# Patient Record
Sex: Male | Born: 1975 | Race: Black or African American | Hispanic: No | Marital: Married | Smoking: Never smoker
Health system: Southern US, Community
[De-identification: ages and names within clinical notes are randomized; demographics above are authoritative.]

## PROBLEM LIST (undated history)

## (undated) DIAGNOSIS — Z8619 Personal history of other infectious and parasitic diseases: Secondary | ICD-10-CM

## (undated) HISTORY — DX: Personal history of other infectious and parasitic diseases: Z86.19

## (undated) HISTORY — PX: INGUINAL HERNIA REPAIR: SUR1180

---

## 2010-06-27 ENCOUNTER — Encounter: Payer: Self-pay | Admitting: Family Medicine

## 2010-06-27 ENCOUNTER — Ambulatory Visit (INDEPENDENT_AMBULATORY_CARE_PROVIDER_SITE_OTHER): Payer: BC Managed Care – PPO | Admitting: Family Medicine

## 2010-06-27 VITALS — BP 110/70 | HR 76 | Temp 98.8°F | Ht 67.0 in | Wt 140.0 lb

## 2010-06-27 DIAGNOSIS — R51 Headache: Secondary | ICD-10-CM

## 2010-06-27 DIAGNOSIS — M25519 Pain in unspecified shoulder: Secondary | ICD-10-CM

## 2010-06-27 DIAGNOSIS — R519 Headache, unspecified: Secondary | ICD-10-CM | POA: Insufficient documentation

## 2010-06-27 DIAGNOSIS — M25511 Pain in right shoulder: Secondary | ICD-10-CM | POA: Insufficient documentation

## 2010-06-27 DIAGNOSIS — Z0000 Encounter for general adult medical examination without abnormal findings: Secondary | ICD-10-CM

## 2010-06-27 DIAGNOSIS — R21 Rash and other nonspecific skin eruption: Secondary | ICD-10-CM

## 2010-06-27 NOTE — Assessment & Plan Note (Signed)
Exam normal today. Possible mild rotator cuff tendinitis, provided with stretching/strengthening exercises for shoulder as well as rubber band.  If not better, further eval.

## 2010-06-27 NOTE — Patient Instructions (Signed)
I think you have pityriasis rosea.  See below.  If not improving in next few weeks, let me know for referral to dermatologist. For shoulder - do stretching exercises provided.  I'm not sure what's causing this pain because exam normal today, but most common cause of shoulder pain is rotator cuff irritation/injury.  If not improving, let us know to set you up with physical therapy. Return at your convenience fasting for blood work (day of physical or a few days prior) and for physical (bring copy of immunizations to that visit). Good to see you today, call us with quesitons.  Pityriasis Rosea Pityriasis rosea is a rash which is probably caused by a virus. It generally starts as a scaly, red patch on the trunk (area a t-shirt would cover) but does not appear on sun exposed areas. The rash is usually preceded by an initial larger spot called the "Herald Patch" a week or more before the rest of the rash appears. Generally within one to two days the rash appears rapidly on the trunk, upper arms, and sometimes the upper legs. The rash usually appears as flat, oval patches of scaly pink to salmon-color. The rash can also be raised and one is able to feel it with their finger. The rash can also be finely crinkled and may slough off leaving a ring of scale around the spot. Sometimes a mild sore throat is present with the rash. It usually affects children and young adults in the spring and autumn. Women are more frequently affected than men. TREATMENT Pityriasis rosea is a self-limited condition. This means it goes away within 4 to 8 weeks without treatment. The spots may persist for several months especially in darker-colored skin after the rash has resolved and healed. Benadryl and steroid creams may be used if itching is a problem. SEEK MEDICAL CARE IF:  Your rash does not go away or persists longer than three months.   You develop fever and joint pain.   You develop severe headache and confusion.   You  develop breathing difficulty, vomiting and/or extreme weakness.  Document Released: 02/28/2001 Document Re-Released: 04/20/2008 [facility] Patient Information 2011 [place], [place].

## 2010-06-27 NOTE — Assessment & Plan Note (Signed)
Unclear etiology, story doesn't correlate with any specific headache pattern.  Monitor for now, rec more exercise in regimen, see if improves as summer months stress decreasing. If not improved, further eval. Return fasting for blood work for [employer]

## 2010-06-27 NOTE — Progress Notes (Signed)
Subjective:    Patient ID: Ryan Riley, male    DOB: July 09, 1975, 35 y.o.   MRN: [id number]  HPI CC: new patient, establish  Requests physical.  Not fasting today.  Will return for this.  No previous PCP.  Skin rash - Thinks it's ringworm.  Noticed a few weeks ago, thought bug bite first then spreading to back.  Started on chest.  Using lotrimin, tea tree oil, vinegar.  seems to be going away.  No recent travel.  No one else at home with similar rash.  No joint pains other than shoulder, no abd pain, no oral lesions, no n/v/f.  No new lotions, detergents, soaps, shampoos.  Not itchy.  Noticed after cutting grass.  R shoulder issues - Tender since football game early this year.  Sometimes with trouble lifting shoulder above head.  Worse when laying on shoulder for prolonged periods of time.  Started after throwing football.  No radiation of pain.  No shoulder injury in past.  Has not tried anything for pain so far.  Worse with throwing motion of football and basketball.  No HA per say, does get pressure L side of head.  Intermittent.  Does think has recently been under increased stress, doesn't have healthy ways of coping with stress other than mowing lawn.  Medications and allergies reviewed and updated in chart. PMHx, surghx, SHx, fmhx reviewed and updated in chart.  Review of Systems  Constitutional: Negative for fever, chills, activity change, appetite change, fatigue and unexpected weight change.  HENT: Negative for hearing loss and neck pain.   Eyes: Negative for visual disturbance.  Respiratory: Negative for cough, choking, chest tightness, shortness of breath and wheezing.   Cardiovascular: Negative for chest pain, palpitations and leg swelling.  Gastrointestinal: Negative for nausea, vomiting, abdominal pain, diarrhea, constipation, blood in stool and abdominal distention.  Genitourinary: Negative for hematuria and difficulty urinating.  Musculoskeletal: Negative for myalgias and  arthralgias.  Skin: Positive for rash.  Neurological: Negative for dizziness, seizures, syncope and headaches.  Hematological: Does not bruise/bleed easily.  Psychiatric/Behavioral: Negative for dysphoric mood. The patient is not nervous/anxious.        Objective:   Physical Exam  Nursing note and vitals reviewed. Constitutional: He is oriented to person, place, and time. He appears well-developed and well-nourished. No distress.  HENT:  Head: Normocephalic and atraumatic.  Right Ear: External ear normal.  Left Ear: External ear normal.  Nose: Nose normal.  Mouth/Throat: Oropharynx is clear and moist.  Eyes: Conjunctivae and EOM are normal. Pupils are equal, round, and reactive to light.  Neck: Normal range of motion. Neck supple. No thyromegaly present.  Cardiovascular: Normal rate, regular rhythm, normal heart sounds and intact distal pulses.   No murmur heard. Pulses:      Radial pulses are 2+ on the right side, and 2+ on the left side.  Pulmonary/Chest: Effort normal and breath sounds normal. No respiratory distress. He has no wheezes. He has no rales.  Abdominal: Soft. Bowel sounds are normal. He exhibits no distension and no mass. There is no tenderness. There is no rebound and no guarding.  Musculoskeletal: Normal range of motion. He exhibits no edema.       Right shoulder: Normal. He exhibits normal range of motion, no tenderness, no swelling, no effusion and normal strength.       Left shoulder: Normal.       No pain/weakness with testing of SITS Neg seer's, neg empty can sign.   Scapulae move  symmetrically  Lymphadenopathy:    He has no cervical adenopathy.  Neurological: He is alert and oriented to person, place, and time.       CN grossly intact, station and gait intact  Skin: Skin is warm and dry. Rash noted.          Scabs on erythematous base, start as erythematous patches, not pruritic, not tender. No herald patch.  Psychiatric: He has a normal mood and  affect. His behavior is normal. Judgment and thought content normal.          Assessment & Plan:

## 2010-06-27 NOTE — Assessment & Plan Note (Signed)
Pityriasis rosea vs nummular eczema vs other?   Reassured if pityriasis, self limiting.   If not improving by next visit, consider steroid cream or referral to dermatology for further evaluation. No systemic sxs. Lesions not consistent with guttate psoriasis.

## 2010-06-29 ENCOUNTER — Other Ambulatory Visit (INDEPENDENT_AMBULATORY_CARE_PROVIDER_SITE_OTHER): Payer: BC Managed Care – PPO

## 2010-06-29 DIAGNOSIS — R51 Headache: Secondary | ICD-10-CM

## 2010-06-29 DIAGNOSIS — R21 Rash and other nonspecific skin eruption: Secondary | ICD-10-CM

## 2010-06-29 DIAGNOSIS — Z0000 Encounter for general adult medical examination without abnormal findings: Secondary | ICD-10-CM

## 2010-06-29 LAB — COMPREHENSIVE METABOLIC PANEL
ALT: 13 U/L (ref 0–53)
AST: 20 U/L (ref 0–37)
Albumin: 4.2 g/dL (ref 3.5–5.2)
Alkaline Phosphatase: 48 U/L (ref 39–117)
Potassium: 4.4 mEq/L (ref 3.5–5.1)
Sodium: 140 mEq/L (ref 135–145)
Total Bilirubin: 0.7 mg/dL (ref 0.3–1.2)
Total Protein: 7 g/dL (ref 6.0–8.3)

## 2010-06-29 LAB — CBC WITH DIFFERENTIAL/PLATELET
Eosinophils Relative: 7.6 % — ABNORMAL HIGH (ref 0.0–5.0)
Lymphocytes Relative: 23.9 % (ref 12.0–46.0)
MCV: 93.2 fl (ref 78.0–100.0)
Monocytes Absolute: 0.5 10*3/uL (ref 0.1–1.0)
Monocytes Relative: 9.8 % (ref 3.0–12.0)
Neutrophils Relative %: 58 % (ref 43.0–77.0)
Platelets: 182 10*3/uL (ref 150.0–400.0)
RBC: 4.65 Mil/uL (ref 4.22–5.81)
WBC: 4.7 10*3/uL (ref 4.5–10.5)

## 2010-06-29 LAB — LIPID PANEL
LDL Cholesterol: 89 mg/dL (ref 0–99)
Total CHOL/HDL Ratio: 3
VLDL: 7.2 mg/dL (ref 0.0–40.0)

## 2010-06-29 LAB — TSH: TSH: 1.09 u[IU]/mL (ref 0.35–5.50)

## 2010-07-04 ENCOUNTER — Ambulatory Visit (INDEPENDENT_AMBULATORY_CARE_PROVIDER_SITE_OTHER): Payer: BC Managed Care – PPO | Admitting: Family Medicine

## 2010-07-04 ENCOUNTER — Encounter: Payer: Self-pay | Admitting: Family Medicine

## 2010-07-04 VITALS — BP 122/70 | HR 80 | Temp 99.0°F | Wt 142.1 lb

## 2010-07-04 DIAGNOSIS — M25511 Pain in right shoulder: Secondary | ICD-10-CM

## 2010-07-04 DIAGNOSIS — M25519 Pain in unspecified shoulder: Secondary | ICD-10-CM

## 2010-07-04 DIAGNOSIS — Z0000 Encounter for general adult medical examination without abnormal findings: Secondary | ICD-10-CM

## 2010-07-04 DIAGNOSIS — R21 Rash and other nonspecific skin eruption: Secondary | ICD-10-CM

## 2010-07-04 DIAGNOSIS — Z23 Encounter for immunization: Secondary | ICD-10-CM

## 2010-07-04 NOTE — Assessment & Plan Note (Signed)
Discussed healthy eating. Preventative protocols reviewed. Tdap today. Vegetarian - recommended daily MVI, provided with handout on foods with B12.

## 2010-07-04 NOTE — Assessment & Plan Note (Signed)
Last visit thought pityriasis rosea, however per pt multiplying and enlarging. Referral to derm for eval.

## 2010-07-04 NOTE — Progress Notes (Signed)
Addended by: Patience Musca on: 07/04/2010 02:27 PM   Modules accepted: Orders

## 2010-07-04 NOTE — Patient Instructions (Addendum)
If shoulder starts bothering you more, schedule appointment with Dr. Patsy Lager in our office (sports medicine doctor). B12 comes from animal products and dairy.  Recommend daily multivitamin. Pass by Marion's office up front for referral for dermatologist. Tdap today. Good to see you today, call us with questions. Return in 1-2 years for next physical or as needed.

## 2010-07-04 NOTE — Progress Notes (Signed)
  Subjective:    Patient ID: Ryan Riley, male    DOB: 1975-07-20, 35 y.o.   MRN: [id number]  HPI CC: CPE  Skin rash - spots multiplying, previous spots seem to be getting bigger.  Shoulder - doing ok.  Still trouble laying on it at night.  Not bothering him currently.  Uses city water, no recent travel outside of country.  Preventative: Last tetanus 12/05/2000.  Requests Tdap.  OPV x 5, MMR x 2.  Brings log of immunizations. Reviewed blood work today.  Review of Systems  Constitutional: Negative for fever, chills, activity change, appetite change, fatigue and unexpected weight change.  HENT: Negative for hearing loss and neck pain.   Respiratory: Negative for cough, choking, chest tightness, shortness of breath and wheezing.   Cardiovascular: Negative for chest pain, palpitations and leg swelling.  Gastrointestinal: Negative for nausea, vomiting, abdominal pain, diarrhea, constipation, blood in stool and abdominal distention.  Genitourinary: Negative for hematuria and difficulty urinating.  Musculoskeletal: Negative for myalgias and arthralgias.  Skin: Negative for rash.  Neurological: Negative for dizziness, seizures, syncope and headaches.  Hematological: Does not bruise/bleed easily.  Psychiatric/Behavioral: Negative for dysphoric mood. The patient is not nervous/anxious.        Objective:   Physical Exam  Nursing note and vitals reviewed. Constitutional: He is oriented to person, place, and time. He appears well-developed and well-nourished. No distress.  HENT:  Head: Normocephalic and atraumatic.  Right Ear: External ear normal.  Left Ear: External ear normal.  Nose: Nose normal.  Mouth/Throat: Oropharynx is clear and moist.  Eyes: Conjunctivae and EOM are normal. Pupils are equal, round, and reactive to light.  Neck: Normal range of motion. Neck supple. No thyromegaly present.  Cardiovascular: Normal rate, regular rhythm, normal heart sounds and intact distal  pulses.   No murmur heard. Pulses:      Radial pulses are 2+ on the right side, and 2+ on the left side.  Pulmonary/Chest: Effort normal and breath sounds normal. No respiratory distress. He has no wheezes. He has no rales.  Abdominal: Soft. Bowel sounds are normal. He exhibits no distension and no mass. There is no tenderness. There is no rebound and no guarding.  Musculoskeletal: He exhibits no edema.  Lymphadenopathy:    He has no cervical adenopathy.  Neurological: He is alert and oriented to person, place, and time.       CN grossly intact, station and gait intact  Skin: Skin is warm and dry. Rash noted.       Hyperpigmented macules, some raised.  No scabs present today. Some new erythematous papules on back and abd No herald patch.  Psychiatric: He has a normal mood and affect. His behavior is normal. Judgment and thought content normal.          Assessment & Plan:

## 2010-07-04 NOTE — Assessment & Plan Note (Signed)
No recent travel.  Uses city water.  No GI sxs.  No systemic sxs. Absolute eosinophil count WNL.  Monitor for now. ? Seasonal allergies (pt has h/o this).

## 2010-07-04 NOTE — Assessment & Plan Note (Signed)
Actually seems some improved.   Discussed if worsening, to schedule appt with Dr. Salena Saner for eval. ?RTC tendinopathy as worse with certain positions at night, although last visit exam WNL.

## 2013-04-03 ENCOUNTER — Ambulatory Visit (INDEPENDENT_AMBULATORY_CARE_PROVIDER_SITE_OTHER)
Admission: RE | Admit: 2013-04-03 | Discharge: 2013-04-03 | Disposition: A | Discharge status: Home / Self Care | Payer: BC Managed Care – PPO | Source: Ambulatory Visit | Attending: Internal Medicine | Admitting: Internal Medicine

## 2013-04-03 ENCOUNTER — Ambulatory Visit (INDEPENDENT_AMBULATORY_CARE_PROVIDER_SITE_OTHER): Payer: BC Managed Care – PPO | Admitting: Internal Medicine

## 2013-04-03 ENCOUNTER — Encounter: Payer: Self-pay | Admitting: Internal Medicine

## 2013-04-03 VITALS — BP 114/76 | HR 86 | Temp 98.7°F | Wt 144.5 lb

## 2013-04-03 DIAGNOSIS — M25519 Pain in unspecified shoulder: Secondary | ICD-10-CM

## 2013-04-03 DIAGNOSIS — M25531 Pain in right wrist: Secondary | ICD-10-CM

## 2013-04-03 DIAGNOSIS — M25512 Pain in left shoulder: Secondary | ICD-10-CM

## 2013-04-03 DIAGNOSIS — M25539 Pain in unspecified wrist: Secondary | ICD-10-CM

## 2013-04-03 MED ORDER — MELOXICAM 15 MG PO TABS: 15.0000 mg | ORAL_TABLET | Freq: Every day | ORAL | Status: DC

## 2013-04-03 NOTE — Progress Notes (Signed)
Subjective:    Patient ID: Ryan Riley, male    DOB: Dec 27, 1975, 38 y.o.   MRN: [id number]  HPI  Pt presents to the clinic today with c/o left shoulder pain. This started about 2 weeks ago. He describes the pain as sharp. The pain does radiate down to the elbow. He denies any specific injury to the area. He has taken Ibuprofen OTC without any relief. Additionally, he c/o right wrist pain. This started about 2 months ago. It occurs intermittently. He does report that he feels a tingling sensation in his hand. He denies numbness. He thought it may be carpal tunnel so he bought a cock up wrist brace. He does type on a computer a lot.  Review of Systems      Past Medical History  Diagnosis Date  . History of chicken pox     No current outpatient prescriptions on file.   No current facility-administered medications for this visit.    No Known Allergies  Family History  Problem Relation Age of Onset  . Coronary artery disease Father 1950  . Diabetes Maternal Grandmother   . Cancer Paternal Grandmother     ovarian  . Stroke Neg Hx     History   Social History  . Marital Status: Married    Spouse Name: N/A    Number of Children: 2  . Years of Education: PhD   Occupational History  . 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 Smoking status: Never Smoker   . Smokeless tobacco: Never Used  . Alcohol Use: No  . Drug Use: No  . Sexual Activity: Not on file   Other Topics Concern  . Not on file   Social History Narrative   Caffeine: none   Vegetarian, eats occasional fish   Finished PhD in business administration at [employer]   Professor at [employer]   Lives with wife, 2 sons (2002, 2008), no pets     Constitutional: Denies fever, malaise, fatigue, headache or abrupt weight changes.  Musculoskeletal: Pt reports left shoulder pain and right wrist pain. Denies decrease in range of motion, difficulty with  gait, muscle pain or joint swelling.   Neurological: Pt reports tingling in the left arm. Denies dizziness, difficulty with memory, difficulty with speech or problems with balance and coordination.   No other specific complaints in a complete review of systems (except as listed in HPI above).  Objective:   Physical Exam   BP 114/76  Pulse 86  Temp(Src) 98.7 F (37.1 C) (Oral)  Wt 144 lb 8 oz (65.545 kg)  SpO2 98% Wt Readings from Last 3 Encounters:  04/03/13 144 lb 8 oz (65.545 kg)  07/04/10 142 lb 1.3 oz (64.447 kg)  06/27/10 140 lb (63.504 kg)    General: Appears his stated age, well developed, well nourished in NAD.  Cardiovascular: Normal rate and rhythm. S1,S2 noted.  No murmur, rubs or gallops noted. No JVD or BLE edema. No carotid bruits noted. Pulmonary/Chest: Normal effort and positive vesicular breath sounds. No respiratory distress. No wheezes, rales or ronchi noted.  Musculoskeletal: Normal internal rotation of the left shoulder. Decreased external rotations of the left shoulder. Crepitus noted with ROM of left shoulder. No signs of joint swelling. No difficulty with gait.  Neurological: Alert and oriented. + Phalens and Tinels sign. +DTRs bilaterally.   BMET    Component Value Date/Time   NA 140 06/29/2010  0842   K 4.4 06/29/2010 0842   CL 103 06/29/2010 0842   CO2 31 06/29/2010 0842   GLUCOSE 95 06/29/2010 0842   BUN 13 06/29/2010 0842   CREATININE 0.9 06/29/2010 0842   CALCIUM 9.4 06/29/2010 0842    Lipid Panel     Component Value Date/Time   CHOL 142 06/29/2010 0842   TRIG 36.0 06/29/2010 0842   HDL 45.80 06/29/2010 0842   CHOLHDL 3 06/29/2010 0842   VLDL 7.2 06/29/2010 0842   LDLCALC 89 06/29/2010 0842    CBC    Component Value Date/Time   WBC 4.7 06/29/2010 0842   RBC 4.65 06/29/2010 0842   HGB 15.0 06/29/2010 0842   HCT 43.3 06/29/2010 0842   PLT 182.0 06/29/2010 0842   MCV 93.2 06/29/2010 0842   MCHC 34.5 06/29/2010 0842   RDW 12.7 06/29/2010 0842    LYMPHSABS 1.1 06/29/2010 0842   MONOABS 0.5 06/29/2010 0842   EOSABS 0.4 06/29/2010 0842   BASOSABS 0.0 06/29/2010 0842    Hgb A1C No results found for this basename: HGBA1C        Assessment & Plan:   Left shoulder pain:  ? Degenerative disease Will start Mobic daily for pain inflammation Will obtain xray of left shoulder  Right wrist pain:  ? Carpal tunnel The brace he has is not effective He would like referral to sports medicine for further evaluation and treatment  RTC as needed or if symptoms persist

## 2013-04-03 NOTE — Patient Instructions (Addendum)

## 2013-04-03 NOTE — Progress Notes (Signed)
Pre visit review using our clinic review tool, if applicable. No additional management support is needed unless otherwise documented below in the visit note. 

## 2013-04-27 ENCOUNTER — Ambulatory Visit (INDEPENDENT_AMBULATORY_CARE_PROVIDER_SITE_OTHER)
Admission: RE | Admit: 2013-04-27 | Discharge: 2013-04-27 | Disposition: A | Discharge status: Home / Self Care | Payer: BC Managed Care – PPO | Source: Ambulatory Visit | Attending: Family Medicine | Admitting: Family Medicine

## 2013-04-27 ENCOUNTER — Encounter: Payer: Self-pay | Admitting: Family Medicine

## 2013-04-27 ENCOUNTER — Ambulatory Visit (INDEPENDENT_AMBULATORY_CARE_PROVIDER_SITE_OTHER): Payer: BC Managed Care – PPO | Admitting: Family Medicine

## 2013-04-27 VITALS — BP 120/64 | HR 69 | Temp 99.0°F | Ht 68.0 in | Wt 146.5 lb

## 2013-04-27 DIAGNOSIS — M25539 Pain in unspecified wrist: Secondary | ICD-10-CM

## 2013-04-27 DIAGNOSIS — M67912 Unspecified disorder of synovium and tendon, left shoulder: Secondary | ICD-10-CM

## 2013-04-27 DIAGNOSIS — M6790 Unspecified disorder of synovium and tendon, unspecified site: Secondary | ICD-10-CM

## 2013-04-27 DIAGNOSIS — M25531 Pain in right wrist: Secondary | ICD-10-CM

## 2013-04-27 DIAGNOSIS — M719 Bursopathy, unspecified: Secondary | ICD-10-CM

## 2013-04-27 NOTE — Progress Notes (Signed)
Date:  04/27/2013   Name:  Ryan LovelyFrank Riley   DOB:  Mar 23, 1975   MRN:  [id number]  Primary Physician:  Eustaquio BoydenJavier Gutierrez, MD   Chief Complaint: Shoulder Pain and Wrist Pain   Subjective:   History of Present Illness:  Ryan Riley is a 38 y.o. pleasant patient who presents with the following:  Right wrist and hand.  Hand and will feel it all the way up.   Left shoulder. Originally could not move shoulder much.  No known injury.  1 month, started in February.  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  He does have some pain with terminal internal range of motion and occasionally with abduction. He has not had any injury, trauma, fracture, or any prior operative intervention in the affected hand her shoulder.  L SHOULDER GIRD.  RIGHT wrist he has no known trauma or injury. He does have some swelling, and has improved a little bit. He does have a carpal tunnel splint, but this does still allow ulnar and radial deviation. She was recently given some meloxicam, but he has not started this, and he is not taking any oral medications. He has not tried any ice or heat. He does do a lot of repetitive things with his RIGHT wrist using a computer.  Patient Active Problem List   Diagnosis Date Noted  . Routine general medical examination at a health care facility 06/27/2010    Past Medical History  Diagnosis Date  . History of chicken pox     Past Surgical History  Procedure Laterality Date  . Inguinal hernia repair      Right side as child    History   Social History  . Marital Status: Married    Spouse Name: N/A    Number of Children: 2  . Years of Education: PhD   Occupational History  . 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 Smoking status: Never Smoker   . Smokeless tobacco: Never Used  . Alcohol Use: No  . Drug Use: No  . Sexual  Activity: Not on file   Other Topics Concern  . Not on file   Social History Narrative   Caffeine: none   Vegetarian, eats occasional fish   Finished PhD in business administration at [employer]   Professor at [employer]   Lives with wife, 2 sons (2002, 2008), no pets    Family History  Problem Relation Age of Onset  . Coronary artery disease Father 7950  . Diabetes Maternal Grandmother   . Cancer Paternal Grandmother     ovarian  . Stroke Neg Hx     No Known Allergies  Medication list has been reviewed and updated.  Review of Systems:  GEN: No fevers, chills. Nontoxic. Primarily MSK c/o today. MSK: Detailed in the HPI GI: tolerating PO intake without difficulty Neuro: No numbness, parasthesias, or tingling associated. Otherwise the pertinent positives of the ROS are noted above.   Objective:   Physical Examination: BP 120/64  Pulse 69  Temp(Src) 99 F (37.2 C) (Oral)  Ht 5\' 8"  (1.727 m)  Wt 146 lb 8 oz (66.452 kg)  BMI 22.28 kg/m2  Ideal Body Weight: Weight in (lb) to have BMI = 25: 164.1   GEN: Well-developed,well-nourished,in no acute distress; alert,appropriate and cooperative throughout examination HEENT: Normocephalic and atraumatic without obvious  abnormalities. Ears, externally no deformities PULM: Breathing comfortably in no respiratory distress EXT: No clubbing, cyanosis, or edema PSYCH: Normally interactive. Cooperative during the interview. Pleasant. Friendly and conversant. Not anxious or depressed appearing. Normal, full affect.  Shoulder: L Inspection: No muscle wasting or winging Ecchymosis/edema: neg  AC joint, scapula, clavicle: NT Cervical spine: NT, full ROM Spurling's: neg Abduction: full, 5/5 Flexion: full, 5/5 IR, lack 35 deg compared to R, lift-off: 5/5 ER at neutral: full, 5/5 AC crossover: neg Neer: neg Hawkins: pos Drop Test: neg Empty Can: pos Supraspinatus insertion: nt Bicipital groove: NT Speed's: neg Yergason's:  neg Sulcus sign: neg Scapular dyskinesis: none C5-T1 intact  Neuro: Sensation intact Grip 5/5   The patient is RIGHT wrist does have some swelling in the wrist. There is no tenderness along the ulnar radius. Nontender throughout in the phalanges. Nontender at the MCPs, and there is no swelling in this area. Nontender along all the metacarpals. In that her wrist and in the dorsum there is some fullness and tenderness to palpation. There is tenderness with both ulnar and radial deviation. There is a lack of approximately 35 of extension and the wrist. Axial loading is notably tender as well.  Dg Wrist Complete Right  04/27/2013   CLINICAL DATA:  Wrist pain for 2 months  EXAM: RIGHT WRIST - COMPLETE 3+ VIEW  COMPARISON:  None.  FINDINGS: No distal radius or ulnar fracture. Radiocarpal joint is intact. No carpal fracture. No soft tissue abnormality.  IMPRESSION: No acute osseous abnormality.   Electronically Signed   By: Genevive Bi M.D.   On: 04/27/2013 13:42   Dg Shoulder Left  04/03/2013   CLINICAL DATA:  Left shoulder pain  EXAM: LEFT SHOULDER - 2+ VIEW  COMPARISON:  None.  FINDINGS: There is no evidence of fracture or dislocation. There is no evidence of arthropathy or other focal bone abnormality. Soft tissues are unremarkable.  IMPRESSION: Negative.   Electronically Signed   By: Natasha Mead M.D.   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 He was given rotator cuff rehabilitation.  RIGHT wrist I think is most likely dorsal synovitis, but cannot fully exclude ligamentous derangement. For now, and then place him in a thumb spica splint for one month, initiate oral meloxicam, and icing after work.  Return in about 1 month (around 05/28/2013).  Orders Placed This Encounter   Procedures  . DG Wrist Complete Right   Patient's Medications  New Prescriptions   No medications on file  Previous Medications   MELOXICAM (MOBIC) 15 MG TABLET    Take 1 tablet (15 mg total) by mouth daily.  Modified Medications   No medications on file  Discontinued Medications   No medications on file   There are no Patient Instructions on file for this visit.  Signed,  Elpidio Galea. Dhiya Smits, MD, CAQ Sports Medicine  [employer] [facility] [address] [place] [address] Phone: [phone] Fax: [phone]

## 2013-04-27 NOTE — Progress Notes (Signed)
Pre visit review using our clinic review tool, if applicable. No additional management support is needed unless otherwise documented below in the visit note. 

## 2014-12-03 ENCOUNTER — Encounter: Payer: Self-pay | Admitting: Family Medicine

## 2014-12-03 ENCOUNTER — Ambulatory Visit (INDEPENDENT_AMBULATORY_CARE_PROVIDER_SITE_OTHER): Payer: BC Managed Care – PPO | Admitting: Family Medicine

## 2014-12-03 VITALS — BP 124/78 | HR 80 | Temp 98.3°F | Ht 67.0 in | Wt 153.5 lb

## 2014-12-03 DIAGNOSIS — Z789 Other specified health status: Secondary | ICD-10-CM | POA: Diagnosis not present

## 2014-12-03 DIAGNOSIS — Z13220 Encounter for screening for lipoid disorders: Secondary | ICD-10-CM | POA: Diagnosis not present

## 2014-12-03 DIAGNOSIS — Z0000 Encounter for general adult medical examination without abnormal findings: Secondary | ICD-10-CM

## 2014-12-03 DIAGNOSIS — R21 Rash and other nonspecific skin eruption: Secondary | ICD-10-CM | POA: Diagnosis not present

## 2014-12-03 DIAGNOSIS — Z131 Encounter for screening for diabetes mellitus: Secondary | ICD-10-CM

## 2014-12-03 DIAGNOSIS — M25512 Pain in left shoulder: Secondary | ICD-10-CM

## 2014-12-03 DIAGNOSIS — G8929 Other chronic pain: Secondary | ICD-10-CM | POA: Insufficient documentation

## 2014-12-03 DIAGNOSIS — M25531 Pain in right wrist: Secondary | ICD-10-CM

## 2014-12-03 LAB — LIPID PANEL
CHOLESTEROL: 168 mg/dL (ref 0–200)
HDL: 45.3 mg/dL (ref >39.00)
LDL Cholesterol: 111 mg/dL — ABNORMAL HIGH (ref 0–99)
NONHDL: 122.59
Total CHOL/HDL Ratio: 4
Triglycerides: 57 mg/dL (ref 0.0–149.0)
VLDL: 11.4 mg/dL (ref 0.0–40.0)

## 2014-12-03 LAB — BASIC METABOLIC PANEL
BUN: 10 mg/dL (ref 6–23)
CALCIUM: 9.7 mg/dL (ref 8.4–10.5)
CO2: 29 mEq/L (ref 19–32)
Chloride: 104 mEq/L (ref 96–112)
Creatinine, Ser: 0.92 mg/dL (ref 0.40–1.50)
GFR: 96.35 mL/min (ref >60.00)
GLUCOSE: 85 mg/dL (ref 70–99)
POTASSIUM: 4.4 meq/L (ref 3.5–5.1)
SODIUM: 141 meq/L (ref 135–145)

## 2014-12-03 LAB — VITAMIN B12: Vitamin B-12: 333 pg/mL (ref 211–911)

## 2014-12-03 MED ORDER — DOXYCYCLINE HYCLATE 100 MG PO TABS: 100.0000 mg | ORAL_TABLET | Freq: Two times a day (BID) | ORAL | Status: AC

## 2014-12-03 NOTE — Assessment & Plan Note (Addendum)
Unclear etiology - possible RTC sprain/partial tear or ?AC separation.  rec start RTC injury exercises from [facility] and update with effect. Pt agrees. If no better, consider imaging given chronicity of sxs.

## 2014-12-03 NOTE — Assessment & Plan Note (Signed)
Acne vs folliculitis. Reviewed soap use. Treat with doxy 7d course and update with effect. If persistent, consider derm referral vs topical bnezaclin

## 2014-12-03 NOTE — Progress Notes (Signed)
Pre visit review using our clinic review tool, if applicable. No additional management support is needed unless otherwise documented below in the visit note. 

## 2014-12-03 NOTE — Progress Notes (Signed)
BP 124/78 mmHg  Pulse 80  Temp(Src) 98.3 F (36.8 C) (Oral)  Ht  (1.702 m)  Wt 153 lb 8 oz (69.627 kg)  BMI 24.04 kg/m2   CC: CPE  Subjective:    Patient ID: Ryan Riley, male    DOB: 11-06-1975, 39 y.o.   MRN: [id number]  HPI: Ryan Riley is a 39 y.o. male presenting on 12/03/2014 for Annual Exam   Going for tenure at [employer]. Increased stress.  Rash on chest that comes and goes. No itching or pain. No new soaps or lotions, detergents, shampoos. No new medicine, foods.   R wrist pain ongoing for last 1+ year. Right handed. Intermittent flares where wrist gets swollen, warm, itchy red and tender. Repetitive typing at work. Does have ergonomic keyboard.  Ongoing L shoulder pain for 1+ yr with stiffness - points to entire anterior shoulder. Denies inciting trauma/ fall. No shooting pain down arm. No numbness or weakness of left shoulder.   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 Interim medical history since our last visit reviewed. Allergies and medications reviewed and updated. No current outpatient prescriptions on file prior to visit.   No current facility-administered medications on file prior to visit.    Review of Systems  Constitutional: Negative for fever, chills, activity change, appetite change, fatigue and unexpected weight change.  HENT: Negative for hearing loss.   Eyes: Negative for visual disturbance.  Respiratory: Negative for cough, chest tightness, shortness of breath and wheezing.   Cardiovascular: Negative for chest pain, palpitations and leg swelling.  Gastrointestinal: Negative for nausea,  vomiting, abdominal pain, diarrhea, constipation, blood in stool and abdominal distention.  Genitourinary: Negative for hematuria and difficulty urinating.  Musculoskeletal: Negative for myalgias, arthralgias and neck pain.  Skin: Negative for rash.  Neurological: Negative for dizziness, seizures, syncope and headaches.  Hematological: Negative for adenopathy. Does not bruise/bleed easily.  Psychiatric/Behavioral: Negative for dysphoric mood. The patient is not nervous/anxious.    Per HPI unless specifically indicated in ROS section     Objective:    BP 124/78 mmHg  Pulse 80  Temp(Src) 98.3 F (36.8 C) (Oral)  Ht  (1.702 m)  Wt 153 lb 8 oz (69.627 kg)  BMI 24.04 kg/m2  Wt Readings from Last 3 Encounters:  12/03/14 153 lb 8 oz (69.627 kg)  04/27/13 146 lb 8 oz (66.452 kg)  04/03/13 144 lb 8 oz (65.545 kg)    Physical Exam  Constitutional: He is oriented to person, place, and time. He appears well-developed and well-nourished. No distress.  HENT:  Head: Normocephalic and atraumatic.  Right Ear: Hearing, tympanic membrane, external ear and ear canal normal.  Left Ear: Hearing, tympanic membrane, external ear and ear canal normal.  Nose: Nose normal.  Mouth/Throat: Uvula is midline, oropharynx is clear and moist and mucous membranes are normal. No oropharyngeal exudate, posterior oropharyngeal edema or posterior oropharyngeal erythema.  Eyes: Conjunctivae and EOM are normal. Pupils are equal, round, and reactive to light. No scleral icterus.  Neck: Normal range of motion. Neck supple. No thyromegaly present.  Cardiovascular: Normal rate, regular rhythm, normal heart sounds and intact distal pulses.   No murmur  heard. Pulses:      Radial pulses are 2+ on the right side, and 2+ on the left side.  Pulmonary/Chest: Effort normal and breath sounds normal. No respiratory distress. He has no wheezes. He has no rales.  Abdominal: Soft. Bowel sounds are normal. He exhibits no  distension and no mass. There is no tenderness. There is no rebound and no guarding.  Musculoskeletal: Normal range of motion. He exhibits no edema.  2+ DP bilaterally. No active synovitis at R wrist or hand, no pain at epicondyles, no pain at scaphoid or 1st CMC, FROM at wrist but some discomfort dorsal hand with forced flexion R shoulder WNL L Shoulder exam: No deformity of shoulders on inspection. No pain with palpation of shoulder landmarks. FROM in abduction and forward flexion. Mild discomfort but no weakness with testing SITS in ext/int rotation. No significant pain with empty can sign. Neg Yerguson, Speed test. No impingement. + pain with crossover test. No pain with rotation of humeral head in GH joint.   Lymphadenopathy:    He has no cervical adenopathy.  Neurological: He is alert and oriented to person, place, and time.  CN grossly intact, station and gait intact  Skin: Skin is warm and dry. No rash noted.  Psychiatric: He has a normal mood and affect. His behavior is normal. Judgment and thought content normal.  Nursing note and vitals reviewed.  Results for orders placed or performed in visit on 06/29/10  Comprehensive metabolic panel  Result Value Ref Range   Sodium 140 135 - 145 mEq/L   Potassium 4.4 3.5 - 5.1 mEq/L   Chloride 103 96 - 112 mEq/L   CO2 31 19 - 32 mEq/L   Glucose, Bld 95 70 - 99 mg/dL   BUN 13 6 - 23 mg/dL   Creatinine, Ser 0.9 0.4 - 1.5 mg/dL   Total Bilirubin 0.7 0.3 - 1.2 mg/dL   Alkaline Phosphatase 48 39 - 117 U/L   AST 20 0 - 37 U/L   ALT 13 0 - 53 U/L   Total Protein 7.0 6.0 - 8.3 g/dL   Albumin 4.2 3.5 - 5.2 g/dL   Calcium 9.4 8.4 - 19.1 mg/dL   GFR 47.82 >95.62 mL/min  Lipid panel  Result Value Ref Range   Cholesterol 142 0 - 200 mg/dL   Triglycerides 13.0 0.0 - 149.0 mg/dL   HDL 86.57 >84.69 mg/dL   VLDL 7.2 0.0 - 62.9 mg/dL   LDL Cholesterol 89 0 - 99 mg/dL   Total CHOL/HDL Ratio 3   TSH  Result Value Ref Range   TSH 1.09 0.35  - 5.50 uIU/mL  CBC with Differential  Result Value Ref Range   WBC 4.7 4.5 - 10.5 K/uL   RBC 4.65 4.22 - 5.81 Mil/uL   Hemoglobin 15.0 13.0 - 17.0 g/dL   HCT 52.8 41.3 - 24.4 %   MCV 93.2 78.0 - 100.0 fl   MCHC 34.5 30.0 - 36.0 g/dL   RDW 01.0 27.2 - 53.6 %   Platelets 182.0 150.0 - 400.0 K/uL   Neutrophils Relative % 58.0 43.0 - 77.0 %   Lymphocytes Relative 23.9 12.0 - 46.0 %   Monocytes Relative 9.8 3.0 - 12.0 %   Eosinophils Relative 7.6 (H) 0.0 - 5.0 %   Basophils Relative 0.7 0.0 - 3.0 %   Neutro Abs 2.7 1.4 - 7.7 K/uL   Lymphs Abs 1.1 0.7 - 4.0 K/uL   Monocytes Absolute 0.5 0.1 - 1.0 K/uL  Eosinophils Absolute 0.4 0.0 - 0.7 K/uL   Basophils Absolute 0.0 0.0 - 0.1 K/uL      Assessment & Plan:   Problem List Items Addressed This Visit    Skin rash    Acne vs folliculitis. Reviewed soap use. Treat with doxy 7d course and update with effect. If persistent, consider derm referral vs topical bnezaclin      Routine general medical examination at a health care facility - Primary    Preventative protocols reviewed and updated unless pt declined. Discussed healthy diet and lifestyle.       Right wrist pain    Describes episodes of possible active synovitis but no current flare. rec he return when in active flare for eval - and likely labwork (?gout vs other inflammatory arthritis).      Chronic left shoulder pain    Unclear etiology - possible RTC sprain/partial tear or ?AC separation.  rec start RTC [facility] exercises [facility] and update with effect. Pt agrees. If no better, consider imaging given chronicity of sxs.       Other Visit Diagnoses    Lipid screening        Relevant Orders    Lipid panel    Diabetes mellitus screening        Relevant Orders    Basic metabolic panel    Vegetarian        Relevant Orders    Vitamin B12        Follow up plan: Return in about 1 year (around 12/03/2015), or as needed, for annual exam, prior fasting for blood  work.

## 2014-12-03 NOTE — Assessment & Plan Note (Signed)
Describes episodes of possible active synovitis but no current flare. rec he return when in active flare for eval - and likely labwork (?gout vs other inflammatory arthritis).

## 2014-12-03 NOTE — Patient Instructions (Addendum)
For skin rash - possible acne or folliculitis - treat with doxycycline course (avoid sun while on antibiotic). If no better, let us know for referral to dermatologist. Blood work today including B12 level. Incorporate exercise into your routine. You are doing well today, return as needed or in 1-2 years for next physical  Health Maintenance, Male A healthy lifestyle and preventative care can promote health and wellness.  Maintain regular health, dental, and eye exams.  Eat a healthy diet. Foods like vegetables, fruits, whole grains, low-fat dairy products, and lean protein foods contain the nutrients you need and are low in calories. Decrease your intake of foods high in solid fats, added sugars, and salt. Get information about a proper diet from your health care provider, if necessary.  Regular physical exercise is one of the most important things you can do for your health. Most adults should get at least 150 minutes of moderate-intensity exercise (any activity that increases your heart rate and causes you to sweat) each week. In addition, most adults need muscle-strengthening exercises on 2 or more days a week.   Maintain a healthy weight. The body mass index (BMI) is a screening tool to identify possible weight problems. It provides an estimate of body fat based on height and weight. Your health care provider can find your BMI and can help you achieve or maintain a healthy weight. For males 20 years and older:  A BMI below 18.5 is considered underweight.  A BMI of 18.5 to 24.9 is normal.  A BMI of 25 to 29.9 is considered overweight.  A BMI of 30 and above is considered obese.  Maintain normal blood lipids and cholesterol by exercising and minimizing your intake of saturated fat. Eat a balanced diet with plenty of fruits and vegetables. Blood tests for lipids and cholesterol should begin at age 74 and be repeated every 5 years. If your lipid or cholesterol levels are high, you are over  age 44, or you are at high risk for heart disease, you may need your cholesterol levels checked more frequently.Ongoing high lipid and cholesterol levels should be treated with medicines if diet and exercise are not working.  If you smoke, find out from your health care provider how to quit. If you do not use tobacco, do not start.  Lung cancer screening is recommended for adults aged 55-80 years who are at high risk for developing lung cancer because of a history of smoking. A yearly low-dose CT scan of the lungs is recommended for people who have at least a 30-pack-year history of smoking and are current smokers or have quit within the past 15 years. A pack year of smoking is smoking an average of 1 pack of cigarettes a day for 1 year (for example, a 30-pack-year history of smoking could mean smoking 1 pack a day for 30 years or 2 packs a day for 15 years). Yearly screening should continue until the smoker has stopped smoking for at least 15 years. Yearly screening should be stopped for people who develop a health problem that would prevent them from having lung cancer treatment.  If you choose to drink alcohol, do not have more than 2 drinks per day. One drink is considered to be 12 oz (360 mL) of beer, 5 oz (150 mL) of wine, or 1.5 oz (45 mL) of liquor.  Avoid the use of street drugs. Do not share needles with anyone. Ask for help if you need support or instructions about stopping the  use of drugs.  High blood pressure causes heart disease and increases the risk of stroke. High blood pressure is more likely to develop in:  People who have blood pressure in the end of the normal range (100-139/85-89 mm Hg).  People who are overweight or obese.  People who are African American.  If you are 9318-41 years of age, have your blood pressure checked every 3-5 years. If you are 41 years of age or older, have your blood pressure checked every year. You should have your blood pressure measured twice--once  when you are at a hospital or clinic, and once when you are not at a hospital or clinic. Record the average of the two measurements. To check your blood pressure when you are not at a hospital or clinic, you can use:  An automated blood pressure machine at a pharmacy.  A home blood pressure monitor.  If you are 545-41 years old, ask your health care provider if you should take aspirin to prevent heart disease.  Diabetes screening involves taking a blood sample to check your fasting blood sugar level. This should be done once every 3 years after age 41 if you are at a normal weight and without risk factors for diabetes. Testing should be considered at a younger age or be carried out more frequently if you are overweight and have at least 1 risk factor for diabetes.  Colorectal cancer can be detected and often prevented. Most routine colorectal cancer screening begins at the age of 41 and continues through age 675. However, your health care provider may recommend screening at an earlier age if you have risk factors for colon cancer. On a yearly basis, your health care provider may provide home test kits to check for hidden blood in the stool. A small camera at the end of a tube may be used to directly examine the colon (sigmoidoscopy or colonoscopy) to detect the earliest forms of colorectal cancer. Talk to your health care provider about this at age 41 when routine screening begins. A direct exam of the colon should be repeated every 5-10 years through age 41, unless early forms of precancerous polyps or small growths are found.  People who are at an increased risk for hepatitis B should be screened for this virus. You are considered at high risk for hepatitis B if:  You were born in a country where hepatitis B occurs often. Talk with your health care provider about which countries are considered high risk.  Your parents were born in a high-risk country and you have not received a shot to protect  against hepatitis B (hepatitis B vaccine).  You have HIV or AIDS.  You use needles to inject street drugs.  You live with, or have sex with, someone who has hepatitis B.  You are a man who has sex with other men (MSM).  You get hemodialysis treatment.  You take certain medicines for conditions like cancer, organ transplantation, and autoimmune conditions.  Hepatitis C blood testing is recommended for all people born from [id number] through 1965 and any individual with known risk factors for hepatitis C.  Healthy men should no longer receive prostate-specific antigen (PSA) blood tests as part of routine cancer screening. Talk to your health care provider about prostate cancer screening.  Testicular cancer screening is not recommended for adolescents or adult males who have no symptoms. Screening includes self-exam, a health care provider exam, and other screening tests. Consult with your health care provider about any symptoms  you have or any concerns you have about testicular cancer.  Practice safe sex. Use condoms and avoid high-risk sexual practices to reduce the spread of sexually transmitted infections (STIs).  You should be screened for STIs, including gonorrhea and chlamydia if:  You are sexually active and are younger than 24 years.  You are older than 24 years, and your health care provider tells you that you are at risk for this type of infection.  Your sexual activity has changed since you were last screened, and you are at an increased risk for chlamydia or gonorrhea. Ask your health care provider if you are at risk.  If you are at risk of being infected with HIV, it is recommended that you take a prescription medicine daily to prevent HIV infection. This is called pre-exposure prophylaxis (PrEP). You are considered at risk if:  You are a man who has sex with other men (MSM).  You are a heterosexual man who is sexually active with multiple partners.  You take drugs by  injection.  You are sexually active with a partner who has HIV.  Talk with your health care provider about whether you are at high risk of being infected with HIV. If you choose to begin PrEP, you should first be tested for HIV. You should then be tested every 3 months for as long as you are taking PrEP.  Use sunscreen. Apply sunscreen liberally and repeatedly throughout the day. You should seek shade when your shadow is shorter than you. Protect yourself by wearing long sleeves, pants, a wide-brimmed hat, and sunglasses year round whenever you are outdoors.  Tell your health care provider of new moles or changes in moles, especially if there is a change in shape or color. Also, tell your health care provider if a mole is larger than the size of a pencil eraser.  A one-time screening for abdominal aortic aneurysm (AAA) and surgical repair of large AAAs by ultrasound is recommended for men aged 29-75 years who are current or former smokers.  Stay current with your vaccines (immunizations).   This information is not intended to replace advice given to you by your health care provider. Make sure you discuss any questions you have with your health care provider.   Document Released: 07/21/2007 Document Revised: 02/12/2014 Document Reviewed: 06/19/2010 Elsevier Interactive Patient Education [employer].

## 2014-12-03 NOTE — Assessment & Plan Note (Signed)
Preventative protocols reviewed and updated unless pt declined. Discussed healthy diet and lifestyle.  

## 2014-12-06 ENCOUNTER — Encounter: Payer: Self-pay | Admitting: *Deleted

## 2014-12-10 ENCOUNTER — Encounter (INDEPENDENT_AMBULATORY_CARE_PROVIDER_SITE_OTHER): Payer: Self-pay

## 2014-12-10 ENCOUNTER — Encounter: Payer: Self-pay | Admitting: Primary Care

## 2014-12-10 ENCOUNTER — Ambulatory Visit (INDEPENDENT_AMBULATORY_CARE_PROVIDER_SITE_OTHER): Payer: BC Managed Care – PPO | Admitting: Primary Care

## 2014-12-10 VITALS — BP 124/84 | HR 86 | Temp 98.1°F | Ht 67.0 in | Wt 154.8 lb

## 2014-12-10 DIAGNOSIS — M25531 Pain in right wrist: Secondary | ICD-10-CM

## 2014-12-10 DIAGNOSIS — G8929 Other chronic pain: Secondary | ICD-10-CM | POA: Diagnosis not present

## 2014-12-10 DIAGNOSIS — M7721 Periarthritis, right wrist: Secondary | ICD-10-CM | POA: Diagnosis not present

## 2014-12-10 LAB — CBC WITH DIFFERENTIAL/PLATELET
BASOS ABS: 0 10*3/uL (ref 0.0–0.1)
BASOS PCT: 0.5 % (ref 0.0–3.0)
Eosinophils Absolute: 0.3 10*3/uL (ref 0.0–0.7)
Eosinophils Relative: 5.8 % — ABNORMAL HIGH (ref 0.0–5.0)
HEMATOCRIT: 45.1 % (ref 39.0–52.0)
Hemoglobin: 15.1 g/dL (ref 13.0–17.0)
LYMPHS PCT: 15.5 % (ref 12.0–46.0)
Lymphs Abs: 0.9 10*3/uL (ref 0.7–4.0)
MCHC: 33.4 g/dL (ref 30.0–36.0)
MCV: 90.7 fl (ref 78.0–100.0)
MONOS PCT: 10.3 % (ref 3.0–12.0)
Monocytes Absolute: 0.6 10*3/uL (ref 0.1–1.0)
NEUTROS ABS: 3.9 10*3/uL (ref 1.4–7.7)
Neutrophils Relative %: 67.9 % (ref 43.0–77.0)
PLATELETS: 174 10*3/uL (ref 150.0–400.0)
RBC: 4.98 Mil/uL (ref 4.22–5.81)
RDW: 12.6 % (ref 11.5–15.5)
WBC: 5.8 10*3/uL (ref 4.0–10.5)

## 2014-12-10 LAB — RHEUMATOID FACTOR: Rhuematoid fact SerPl-aCnc: <10 IU/mL (ref <=14)

## 2014-12-10 LAB — URIC ACID: Uric Acid, Serum: 4.5 mg/dL (ref 4.0–7.8)

## 2014-12-10 NOTE — Progress Notes (Signed)
Subjective:    Patient ID: Ryan Riley, male    DOB: [birth year], 39 y.o.   MRN: [id number]  HPI  Mr. Leser is a 39 year old male who presents today with a chief complaint of wrist pain. His pain is located circumferentially to the right wrist and has been present intermittently for the past several years. He was evaluated by PCP on 10/28 for his annual exam and was asked to return during an acute flare. This flare up began last night (11/3).  Denies recent injury. His wrist pain will start with a tingle, progress to swelling of wrist, then pain up to right elbow, then swelling to his hand, then erythema. His episode will last for about 1 week. He will take aspirin with some improvement. His last flare was in June/July this summer. 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 He's been wearing a brace during acute flares. He met with Dr. Lorelei Pont last year and was evaluated for carpal tunnel which with a negative exam. His flare ups occur throughout the year and are not related to weather changes.  Review of Systems  Constitutional: Negative for fever and chills.  Musculoskeletal: Positive for joint swelling and arthralgias.  Skin: Positive for color change.       Past Medical History  Diagnosis Date  . History of chicken pox     Social History   Social History  . Marital Status: Married    Spouse Name: N/A  . Number of Children: 2  . Years of Education: PhD   Occupational History  . 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 Smoking status: Never Smoker   . Smokeless tobacco: Never Used  . Alcohol Use: No  . Drug Use: No  . Sexual Activity: Not on file   Other Topics Concern  . Not on file   Social History Narrative   Caffeine: none   Lives with wife, 2 sons (2002, 2008), no pets   Finished PhD in business administration at [employer]   Professor at [employer]   Activity: some yardwork   Diet:  Vegetarian, eats rare fish    Past Surgical History  Procedure Laterality Date  . Inguinal hernia repair      Right side as child    Family History  Problem Relation Age of Onset  . Coronary artery disease Father 59  . Diabetes Maternal Grandmother   . Cancer Paternal Grandmother     ovarian  . Stroke Neg Hx     No Known Allergies  Current Outpatient Prescriptions on File Prior to Visit  Medication Sig Dispense Refill  . doxycycline (VIBRA-TABS) 100 MG tablet Take 1 tablet (100 mg total) by mouth 2 (two) times daily. (Patient not taking: Reported on 12/10/2014) 14 tablet 0   No current facility-administered medications on file prior to visit.    BP 124/84 mmHg  Pulse 86  Temp(Src) 98.1 F (36.7 C) (Oral)  Ht _0  (1.702 m)  Wt 154 lb 12.8 oz (70.217 kg)  BMI 24.24 kg/m2  SpO2 97%    Objective:   Physical Exam  Constitutional: He appears well-nourished.  Cardiovascular: Normal rate and regular rhythm.   Pulmonary/Chest: Effort normal and breath sounds normal.  Musculoskeletal:       Right wrist: He exhibits decreased range of motion, tenderness and swelling. He exhibits no bony tenderness and no deformity.  Assessment & Plan:

## 2014-12-10 NOTE — Progress Notes (Signed)
Pre visit review using our clinic review tool, if applicable. No additional management support is needed unless otherwise documented below in the visit note. 

## 2014-12-10 NOTE — Patient Instructions (Signed)
Complete lab work prior to leaving today. I will notify you of your results.  Continue to wear your brace for support.   You may take Naproxen for any inflammation and pain. This may be purchased over the counter.  Apply ice three times daily for 20 minute intervals as needed for swelling.  I will be in touch with you regarding the next step.  It was a pleasure meeting you!

## 2014-12-10 NOTE — Assessment & Plan Note (Signed)
Pain with inflammation that began on 11/3. No recent injury. Last flare up was summer of 2016. Obvious inflammation to right wrist today. Decrease in ROM upon flexion, extension, lateral movement. Radial pulses 2+. No erythema. Labs for CBC, RF, Uric acid, and ANA pending.

## 2014-12-13 LAB — ANA: Anti Nuclear Antibody(ANA): NEGATIVE

## 2014-12-14 ENCOUNTER — Other Ambulatory Visit: Payer: Self-pay | Admitting: Primary Care

## 2014-12-14 DIAGNOSIS — M25531 Pain in right wrist: Principal | ICD-10-CM

## 2014-12-14 DIAGNOSIS — M25431 Effusion, right wrist: Secondary | ICD-10-CM

## 2014-12-17 ENCOUNTER — Ambulatory Visit (INDEPENDENT_AMBULATORY_CARE_PROVIDER_SITE_OTHER)
Admission: RE | Admit: 2014-12-17 | Discharge: 2014-12-17 | Disposition: A | Discharge status: Home / Self Care | Payer: BC Managed Care – PPO | Source: Ambulatory Visit | Attending: Primary Care | Admitting: Primary Care

## 2014-12-17 DIAGNOSIS — M25431 Effusion, right wrist: Secondary | ICD-10-CM

## 2014-12-17 DIAGNOSIS — M25531 Pain in right wrist: Secondary | ICD-10-CM | POA: Diagnosis not present

## 2014-12-26 ENCOUNTER — Other Ambulatory Visit: Payer: Self-pay | Admitting: Family Medicine

## 2014-12-26 DIAGNOSIS — M25531 Pain in right wrist: Secondary | ICD-10-CM

## 2015-12-02 ENCOUNTER — Other Ambulatory Visit: Payer: BC Managed Care – PPO

## 2015-12-09 ENCOUNTER — Encounter: Payer: BC Managed Care – PPO | Admitting: Family Medicine

## 2016-01-21 IMAGING — CR DG WRIST COMPLETE 3+V*R*
2 series · 2 of 2 positions shown · non-contrast
Comparison: Right wrist series of April 27, 2013

CLINICAL DATA: Intermittent right wrist pain and swelling for
several years without history of trauma

EXAM:
RIGHT WRIST - COMPLETE 3+ VIEW

[view not recorded (1 of 2)]
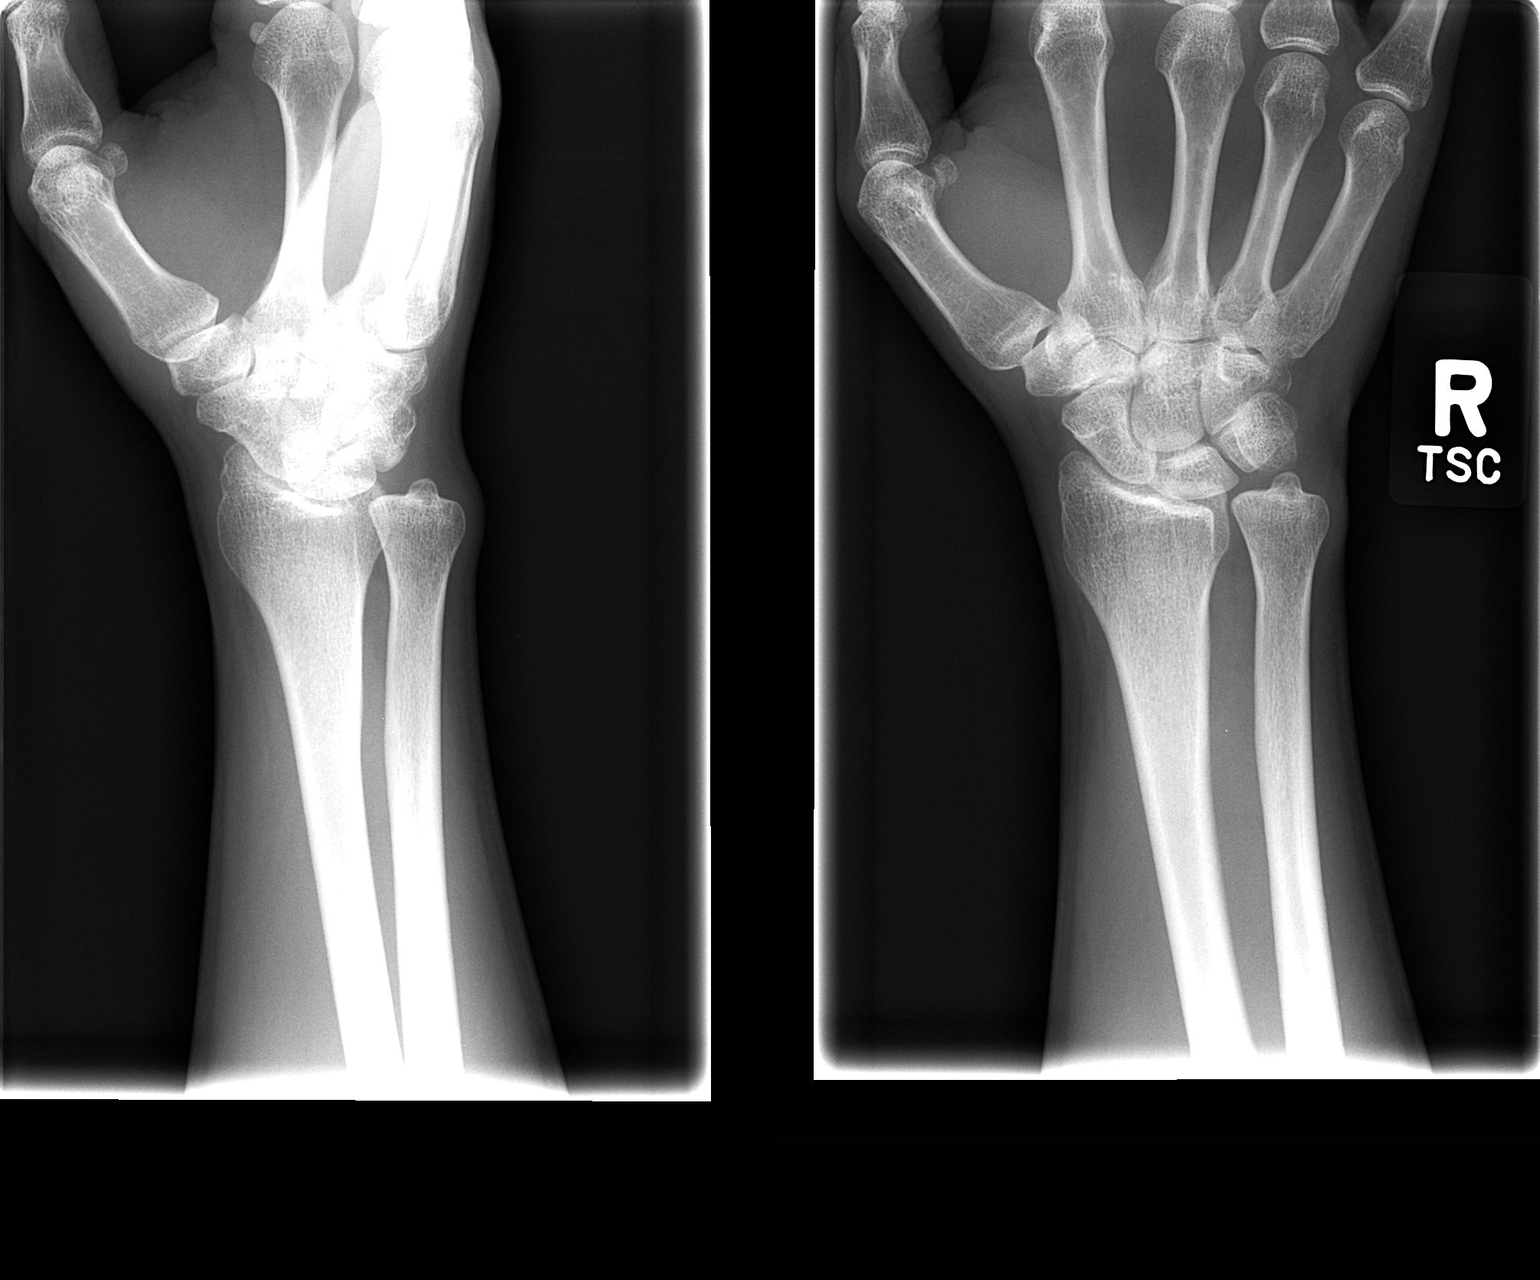

[view not recorded (2 of 2)]
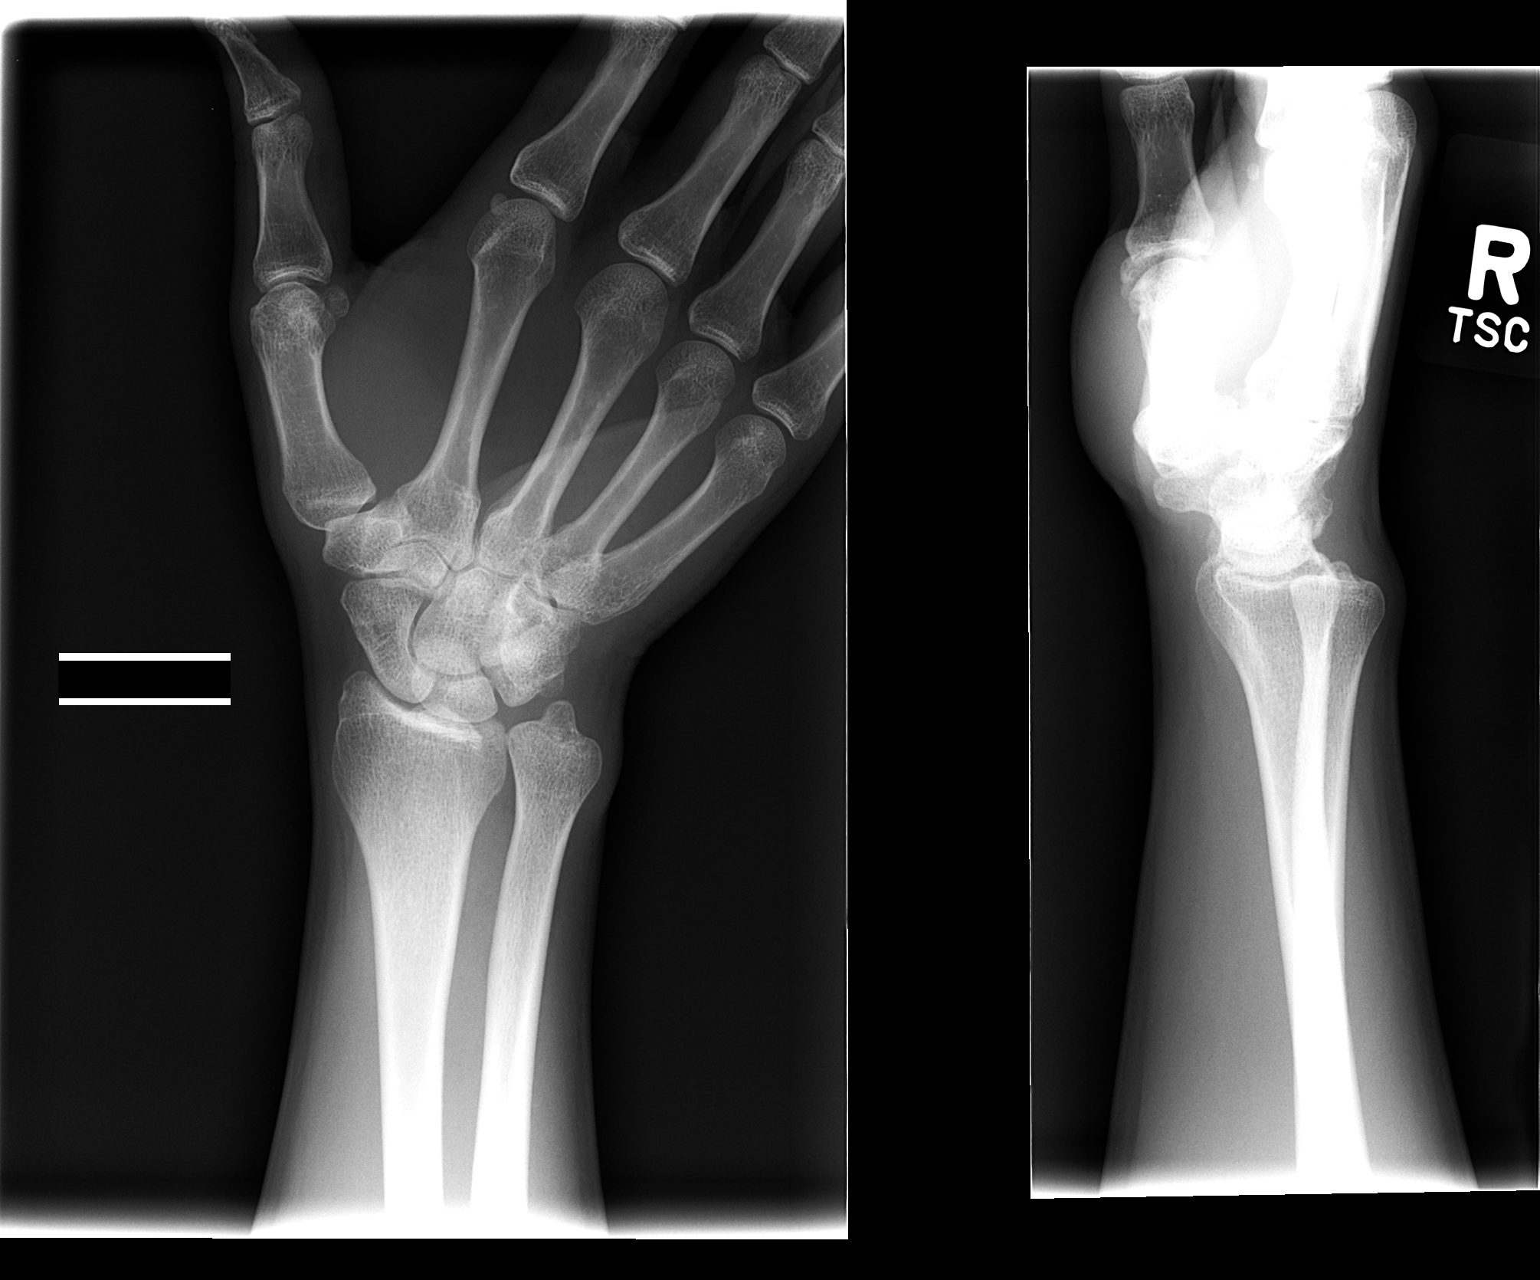

[2 of 2 positions shown; findings below may reference images not displayed]

FINDINGS: The bones of the right wrist are adequately mineralized. There is no
acute or healing fracture. There is no dislocation. There is mild
narrowing of the intercarpal and carpometacarpal joints diffusely.
This has not greatly changed from the previous study. There are no
erosive or proliferative changes. The soft tissues are unremarkable.
There is no chondrocalcinosis.
IMPRESSION: There is no acute bony abnormality of the right wrist. Mild
narrowing of the intercarpal and carpometacarpal joints is noted but
may be normal for the patient.

## 2017-10-19 DIAGNOSIS — J309 Allergic rhinitis, unspecified: Secondary | ICD-10-CM | POA: Insufficient documentation

## 2017-10-19 DIAGNOSIS — F988 Other specified behavioral and emotional disorders with onset usually occurring in childhood and adolescence: Secondary | ICD-10-CM | POA: Insufficient documentation

## 2017-10-19 DIAGNOSIS — F909 Attention-deficit hyperactivity disorder, unspecified type: Secondary | ICD-10-CM
